# Patient Record
Sex: Female | Born: 1990 | Race: Black or African American | Hispanic: No | Marital: Single | Smoking: Current every day smoker
Health system: Southern US, Community
[De-identification: ages and names within clinical notes are randomized; demographics above are authoritative.]

## PROBLEM LIST (undated history)

## (undated) DIAGNOSIS — F1910 Other psychoactive substance abuse, uncomplicated: Secondary | ICD-10-CM

## (undated) DIAGNOSIS — Z789 Other specified health status: Secondary | ICD-10-CM

## (undated) DIAGNOSIS — Z8659 Personal history of other mental and behavioral disorders: Secondary | ICD-10-CM

## (undated) HISTORY — PX: ECTOPIC PREGNANCY SURGERY: SHX613

---
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
## 2012-07-19 ENCOUNTER — Emergency Department: Payer: Self-pay | Admitting: Emergency Medicine

## 2012-07-19 LAB — CBC
HCT: 38.3 % (ref 35.0–47.0)
HGB: 12.9 g/dL (ref 12.0–16.0)
MCHC: 33.8 g/dL (ref 32.0–36.0)
MCV: 90 fL (ref 80–100)
Platelet: 227 10*3/uL (ref 150–440)
WBC: 8.4 10*3/uL (ref 3.6–11.0)

## 2012-07-19 LAB — URINALYSIS, COMPLETE
Bilirubin,UR: NEGATIVE
Blood: NEGATIVE
Glucose,UR: NEGATIVE mg/dL (ref 0–75)
Nitrite: NEGATIVE
Ph: 7 (ref 4.5–8.0)
WBC UR: 1 /HPF (ref 0–5)

## 2012-07-19 LAB — BASIC METABOLIC PANEL
Anion Gap: 8 (ref 7–16)
BUN: 7 mg/dL (ref 7–18)
Chloride: 104 mmol/L (ref 98–107)
Co2: 28 mmol/L (ref 21–32)
Creatinine: 0.59 mg/dL — ABNORMAL LOW (ref 0.60–1.30)
Potassium: 3.5 mmol/L (ref 3.5–5.1)
Sodium: 140 mmol/L (ref 136–145)

## 2012-07-19 LAB — WET PREP, GENITAL

## 2012-08-06 ENCOUNTER — Emergency Department: Payer: Self-pay | Admitting: Emergency Medicine

## 2015-03-06 ENCOUNTER — Emergency Department
Admission: EM | Admit: 2015-03-06 | Discharge: 2015-03-06 | Disposition: A | Discharge status: Home / Self Care | Payer: Medicaid Other | Attending: Emergency Medicine | Admitting: Emergency Medicine

## 2015-03-06 ENCOUNTER — Encounter: Payer: Self-pay | Admitting: Medical Oncology

## 2015-03-06 ENCOUNTER — Emergency Department: Payer: Medicaid Other

## 2015-03-06 DIAGNOSIS — S01511A Laceration without foreign body of lip, initial encounter: Secondary | ICD-10-CM

## 2015-03-06 DIAGNOSIS — Y998 Other external cause status: Secondary | ICD-10-CM | POA: Diagnosis not present

## 2015-03-06 DIAGNOSIS — S1989XA Other specified injuries of other specified part of neck, initial encounter: Secondary | ICD-10-CM | POA: Diagnosis not present

## 2015-03-06 DIAGNOSIS — Y9372 Activity, wrestling: Secondary | ICD-10-CM | POA: Insufficient documentation

## 2015-03-06 DIAGNOSIS — Z720 Tobacco use: Secondary | ICD-10-CM | POA: Diagnosis not present

## 2015-03-06 DIAGNOSIS — Y929 Unspecified place or not applicable: Secondary | ICD-10-CM | POA: Insufficient documentation

## 2015-03-06 DIAGNOSIS — Z23 Encounter for immunization: Secondary | ICD-10-CM | POA: Diagnosis not present

## 2015-03-06 DIAGNOSIS — S0083XA Contusion of other part of head, initial encounter: Secondary | ICD-10-CM

## 2015-03-06 MED ORDER — KETOROLAC TROMETHAMINE 60 MG/2ML IM SOLN
60.0000 mg | Freq: Once | INTRAMUSCULAR | Status: AC
  Administered 2015-03-06: 60 mg via INTRAMUSCULAR

## 2015-03-06 MED ORDER — TETANUS-DIPHTH-ACELL PERTUSSIS 5-2.5-18.5 LF-MCG/0.5 IM SUSP
INTRAMUSCULAR | Status: AC
  Filled 2015-03-06: qty 0.5

## 2015-03-06 MED ORDER — TETANUS-DIPHTH-ACELL PERTUSSIS 5-2.5-18.5 LF-MCG/0.5 IM SUSP
0.5000 mL | Freq: Once | INTRAMUSCULAR | Status: AC
  Administered 2015-03-06: 0.5 mL via INTRAMUSCULAR

## 2015-03-06 MED ORDER — KETOROLAC TROMETHAMINE 60 MG/2ML IM SOLN
INTRAMUSCULAR | Status: AC
  Filled 2015-03-06: qty 2

## 2015-03-06 MED ORDER — AMOXICILLIN-POT CLAVULANATE 875-125 MG PO TABS: 1.0000 | ORAL_TABLET | Freq: Two times a day (BID) | ORAL | Status: AC

## 2015-03-06 MED ORDER — IBUPROFEN 800 MG PO TABS: 800.0000 mg | ORAL_TABLET | Freq: Three times a day (TID) | ORAL | Status: DC | PRN

## 2015-03-06 NOTE — ED Notes (Signed)
Pt left at this time prior to RN checking back in with pt. RN verbalized the medical recommendation to staying until med hold time is up. Pt refused and walked out. Pt stable and in no apparent distress at this time.
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
## 2015-03-06 NOTE — ED Notes (Signed)
Pt placed on med hold until 1945. Pt and friend verbalized understanding, no further needs at this time.
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
## 2015-03-06 NOTE — Discharge Instructions (Signed)
Mouth Laceration A mouth laceration is a cut inside the mouth.  HOME CARE  Rinse your mouth with warm salt water 4 to 6 times a day.  Brush your teeth as usual if you can.  Do not eat hot food or have hot drinks while your mouth is still numb.  Avoid acidic foods or other foods that bother your cut.  Only take medicine as told by your doctor.  Keep all doctor visits as told.  If there are stitches (sutures) in the mouth, do not play with them with your tongue. You may need a tetanus shot if:  You cannot remember when you had your last tetanus shot.  You have never had a tetanus shot. If you need a tetanus shot and you choose not to have one, you may get tetanus. Sickness from tetanus can be serious. GET HELP RIGHT AWAY IF:   Your cut or other parts of your face are puffy (swollen) or painful.  You have a fever.  Your throat is puffy or tender.  Your cut breaks open after stitches have been removed.  You see yellowish-white fluid (pus) coming from the cut. MAKE SURE YOU:   Understand these instructions.  Will watch your condition.  Will get help right away if you are not doing well or get worse. Document Released: 02/10/2008 Document Revised: 01/08/2014 Document Reviewed: 02/26/2011 [facility] Patient Information 2015 [place], [place]. This information is not intended to replace advice given to you by your health care provider. Make sure you discuss any questions you have with your health care provider.  Mandibular Contusion  A mandibular contusion is a deep bruise of your jaw. Contusions happen when an injury causes bleeding under the skin. Signs of bruising include pain, puffiness (swelling), and discolored skin. The contusion may turn blue, purple, or yellow. HOME CARE  Put ice on the injured area.  Put ice in a plastic bag.  Place a towel between your skin and the bag.  Leave the ice on for 15-20 minutes, 03-04 times a day.  Eat soft foods for 1 week. Soft  foods include:  Baby food.  Gelatin.  Cooked cereal.  Ice cream.  Applesauce.  Bananas.  Eggs.  Pasta.  Cottage cheese.  Soup.  Yogurt.  Cut food into small pieces. Avoid chewing gum or ice.  Only take medicines as told by your doctor.  Avoid opening your mouth widely. GET HELP RIGHT AWAY IF:  Your puffiness or pain is not helped by medicine.  You are not getting better.  You have cracking or clicking (crepitation) sounds coming from the jaw. MAKE SURE YOU:   Understand these instructions.  Will watch your condition.  Will get help right away if you are not doing well or get worse. Document Released: 08/13/2011 Document Revised: 02/23/2012 Document Reviewed: 08/13/2011 [facility] Patient Information 2015 [place], [place]. This information is not intended to replace advice given to you by your health care provider. Make sure you discuss any questions you have with your health care provider.

## 2015-04-26 ENCOUNTER — Emergency Department
Admission: EM | Admit: 2015-04-26 | Discharge: 2015-04-26 | Disposition: A | Discharge status: Home / Self Care | Payer: Medicaid Other | Attending: Emergency Medicine | Admitting: Emergency Medicine

## 2015-04-26 DIAGNOSIS — F22 Delusional disorders: Secondary | ICD-10-CM | POA: Diagnosis not present

## 2015-04-26 DIAGNOSIS — Z3202 Encounter for pregnancy test, result negative: Secondary | ICD-10-CM | POA: Diagnosis not present

## 2015-04-26 DIAGNOSIS — A64 Unspecified sexually transmitted disease: Secondary | ICD-10-CM | POA: Diagnosis not present

## 2015-04-26 DIAGNOSIS — Z720 Tobacco use: Secondary | ICD-10-CM | POA: Diagnosis not present

## 2015-04-26 DIAGNOSIS — F1210 Cannabis abuse, uncomplicated: Secondary | ICD-10-CM | POA: Diagnosis not present

## 2015-04-26 DIAGNOSIS — F1410 Cocaine abuse, uncomplicated: Secondary | ICD-10-CM | POA: Diagnosis not present

## 2015-04-26 DIAGNOSIS — N898 Other specified noninflammatory disorders of vagina: Secondary | ICD-10-CM | POA: Diagnosis present

## 2015-04-26 DIAGNOSIS — F1910 Other psychoactive substance abuse, uncomplicated: Secondary | ICD-10-CM

## 2015-04-26 LAB — URINALYSIS COMPLETE WITH MICROSCOPIC (ARMC ONLY)
Bilirubin Urine: NEGATIVE
Glucose, UA: NEGATIVE mg/dL
NITRITE: NEGATIVE
PROTEIN: NEGATIVE mg/dL
Specific Gravity, Urine: 1.017 (ref 1.005–1.030)
pH: 5 (ref 5.0–8.0)

## 2015-04-26 LAB — CBC
HCT: 39.1 % (ref 35.0–47.0)
Hemoglobin: 13 g/dL (ref 12.0–16.0)
MCH: 28.7 pg (ref 26.0–34.0)
MCHC: 33.2 g/dL (ref 32.0–36.0)
MCV: 86.5 fL (ref 80.0–100.0)
PLATELETS: 302 10*3/uL (ref 150–440)
RBC: 4.52 MIL/uL (ref 3.80–5.20)
RDW: 16.5 % — ABNORMAL HIGH (ref 11.5–14.5)
WBC: 10.9 10*3/uL (ref 3.6–11.0)

## 2015-04-26 LAB — PREGNANCY, URINE: PREG TEST UR: NEGATIVE

## 2015-04-26 LAB — BASIC METABOLIC PANEL
Anion gap: 9 (ref 5–15)
BUN: 8 mg/dL (ref 6–20)
CALCIUM: 9.6 mg/dL (ref 8.9–10.3)
CO2: 23 mmol/L (ref 22–32)
Chloride: 105 mmol/L (ref 101–111)
Creatinine, Ser: 0.64 mg/dL (ref 0.44–1.00)
Glucose, Bld: 87 mg/dL (ref 65–99)
Potassium: 4.4 mmol/L (ref 3.5–5.1)
Sodium: 137 mmol/L (ref 135–145)

## 2015-04-26 LAB — URINE DRUG SCREEN, QUALITATIVE (ARMC ONLY)
Amphetamines, Ur Screen: NOT DETECTED
BARBITURATES, UR SCREEN: NOT DETECTED
Benzodiazepine, Ur Scrn: NOT DETECTED
CANNABINOID 50 NG, UR ~~LOC~~: POSITIVE — AB
Cocaine Metabolite,Ur ~~LOC~~: POSITIVE — AB
MDMA (Ecstasy)Ur Screen: NOT DETECTED
Methadone Scn, Ur: NOT DETECTED
Opiate, Ur Screen: NOT DETECTED
PHENCYCLIDINE (PCP) UR S: NOT DETECTED
Tricyclic, Ur Screen: NOT DETECTED

## 2015-04-26 LAB — ACETAMINOPHEN LEVEL: Acetaminophen (Tylenol), Serum: <10 ug/mL — ABNORMAL LOW (ref 10–30)

## 2015-04-26 MED ORDER — AZITHROMYCIN 250 MG PO TABS
1000.0000 mg | ORAL_TABLET | Freq: Once | ORAL | Status: AC
  Administered 2015-04-26: 1000 mg via ORAL
  Filled 2015-04-26: qty 4

## 2015-04-26 MED ORDER — CEFTRIAXONE SODIUM 250 MG IJ SOLR
250.0000 mg | Freq: Once | INTRAMUSCULAR | Status: AC
  Administered 2015-04-26: 250 mg via INTRAMUSCULAR
  Filled 2015-04-26: qty 250

## 2015-04-26 MED ORDER — LORAZEPAM 2 MG PO TABS
2.0000 mg | ORAL_TABLET | Freq: Once | ORAL | Status: AC
  Administered 2015-04-26: 2 mg via ORAL
  Filled 2015-04-26: qty 1

## 2015-04-26 NOTE — ED Notes (Signed)
Patient appears more comfortable since taking ativan  PO.  Patient is lying in bed resting.  Patient still complains that she can't sleep but does not feel paranoid anymore.

## 2015-04-26 NOTE — ED Provider Notes (Addendum)
I accepted patient care at 3 PM. Patient was using substances last night and was having paranoia and jitteriness which were suspected to be due to the ecstasy and cocaine. Patient was referred to talk with psychiatry intake for evaluation and referral for outpatient resources for substance abuse. She is voluntary. At around 5 PM she was requesting to go home. She states her mom will come pick her up. She is not having any active hallucinations, depression, suicidal or homicidal ideation. She states that she is just going to use any more. She was seen by psychiatry intake, and she does not want a psychiatrist, and I do not think she needs emergency psychiatrist evaluation. I will refer her as an outpatient for [facility].  Governor Rooks, MD 04/26/15 1706  Governor Rooks, MD 04/26/15 1710

## 2015-04-26 NOTE — Consult Note (Signed)
  Psychiatry: Consult was received and I reviewed the case with intake coordinator Robinette Haines. Discussed case with ER Dr. Shaune Pollack.chart reviewed. Went to see the patient. She refused to speak with me. Stated that I could just read the chart if I wanted. Patient is not under involuntary commitment. She was requesting discharge and I believe that Dr. Shaune Pollack has allowed her to be discharged from the emergency room as there did not seem to be a clear indication of dangerousness. No further input at this point.

## 2015-04-26 NOTE — BH Assessment (Signed)
Assessment Note  Briana Anderson is an 24 y.o. female who presents to the ER initially with the with the hopes of getting tested for STD's. However, while in the ER, she began to display odd and bizarre behaviors. She began to expressed she was paranoid. Patient admits of ongoing use of mind altering substances. For the last two days, she and her friends were using Cocaine, THC, Alcohol and (Mollies) Ecstasy.  She states, it was purchased from someone they normal don't get it from. Patient believed something "wasn't right" when they were able to buy a large amount for the price. They initially thought they were high but as they continued to use, they realized, something was wrong. Patient states, she felt like there throat was closing in and her friend had to come to the ER, via EMS due to medical complications.    Patient is currently experiencing A/H. During the interview, patient was witnessed responding to internal stimuli. She reported she was seeing different figures and "demons" in the room.  Patient currently denies SI/HI.  She reports of having Mental Health Treatment in the past. It started when she was approximately 24 years old. [facility] was the last agency she was with. Company is now close. She was with them for four years. She states she was diagnosed with Bipolar, with psychotic features. She was unable to give much detail about the treatment. However, she does remember taking medication and stopped when she was 20. "Yea, my checked got cut off and I stop going."  Writer called patient mother (Briana Anderson-[phone]) and left a message for a return phone call, for collateral information.  Phone call was made in the presence of the patient and no identifying information, about the patient was left on the voicemail.    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 Axis IV: other psychosocial or environmental problems, problems related to social  environment, problems with access to health care services and problems with primary support group  Past Medical History: No past medical history on file.  No past surgical history on file.  Family History: No family history on file.  Social History:  reports that she has been smoking.  She does not have any smokeless tobacco history on file. She reports that she drinks alcohol. Her drug history is not on file.  Additional Social History:  Alcohol / Drug Use Pain Medications: None Reported Prescriptions: None Reported Over the Counter: None Reported History of alcohol / drug use?: Yes Longest period of sobriety (when/how long): No sober time reported Negative Consequences of Use: Personal relationships Withdrawal Symptoms:  (None Reported, currently having V/H.) Substance #1 Name of Substance 1: Mollies (Ecstasy) 1 - Age of First Use: 22 1 - Amount (size/oz): Unknown-"alot" 1 - Frequency: Used for the last two days 1 - Duration: Once at age 57 and the last two days. 1 - Last Use / Amount: 04/26/2015 Substance #2 Name of Substance 2: Cocaine  2 - Age of First Use: 20 2 - Amount (size/oz): 1 grams 2 - Frequency: 1 to 2 days 2 - Duration: 4 years 2 - Last Use / Amount: 04/25/2015 Substance #3 Name of Substance 3: THC 3 - Age of First Use: 18 3 - Amount (size/oz): 4 to 5 blunts 3 - Frequency: Daily 3 - Duration: 6 years 3 - Last Use / Amount: 04/26/2015 Substance #4 Name of Substance 4: Alcohol 4 - Age of First Use: 18 4 -  Amount (size/oz): 1 to 3 40oz beers. Based on how much money she have 4 - Frequency: Daily 4 - Duration: Daily 4 - Last Use / Amount: 04/25/2015  CIWA: CIWA-Ar BP: (!) 143/105 mmHg Pulse Rate: (!) 110 COWS:    Allergies: No Known Allergies  Home Medications:  (Not in a hospital admission)  OB/GYN Status:  Patient's last menstrual period was 03/26/2015 (approximate).  General Assessment Data Location of Assessment: [facility] ED TTS Assessment: In  system Is this a Tele or Face-to-Face Assessment?: Face-to-Face Is this an Initial Assessment or a Re-assessment for this encounter?: Initial Assessment Marital status: Single Maiden name: n/a Is patient pregnant?: No Pregnancy Status: No Living Arrangements: Children, Parent Can pt return to current living arrangement?: Yes Admission Status: Voluntary Is patient capable of signing voluntary admission?: Yes Referral Source: Self/Family/Friend Insurance type: Medicaid  Medical Screening Exam [facility] Walk-in ONLY) Medical Exam completed: Yes  Crisis Care Plan Living Arrangements: Children, Parent Name of Psychiatrist: n/a Name of Therapist: n/a  Education Status Is patient currently in school?: No Current Grade: n/a Highest grade of school patient has completed: 12th Grade Name of school: n/a Contact person: n/a  Risk to self with the past 6 months Suicidal Ideation: No Has patient been a risk to self within the past 6 months prior to admission? : No Suicidal Intent: No Has patient had any suicidal intent within the past 6 months prior to admission? : No Is patient at risk for suicide?: No Suicidal Plan?: No Has patient had any suicidal plan within the past 6 months prior to admission? : No Access to Means: No What has been your use of drugs/alcohol within the last 12 months?: Ecstasy, Cocaine, THC & Alcohol Previous Attempts/Gestures: No How many times?: 0 Other Self Harm Risks: 0 Triggers for Past Attempts: None known Intentional Self Injurious Behavior: None Family Suicide History: No Recent stressful life event(s):  (None Reported) Persecutory voices/beliefs?: No Depression: No Depression Symptoms:  (None Reported) Substance abuse history and/or treatment for substance abuse?: No Suicide prevention information given to non-admitted patients: Not applicable  Risk to Others within the past 6 months Homicidal Ideation: No Does patient have any lifetime risk of  violence toward others beyond the six months prior to admission? : No Thoughts of Harm to Others: No Current Homicidal Intent: No Current Homicidal Plan: No Access to Homicidal Means: No Identified Victim: None Reported History of harm to others?: No Assessment of Violence: None Noted Violent Behavior Description: None Reported Does patient have access to weapons?: No Criminal Charges Pending?: No Does patient have a court date: No Is patient on probation?: No  Psychosis Hallucinations: None noted, Visual Delusions: None noted  Mental Status Report Appearance/Hygiene: Unremarkable Eye Contact: Good Motor Activity: Freedom of movement Speech: Logical/coherent Level of Consciousness: Alert Mood: Anxious, Angry Affect: Appropriate to circumstance, Anxious, Fearful Anxiety Level: Moderate Thought Processes: Coherent, Relevant, Irrelevant (Patient is currently impaired from drug use) Judgement: Impaired Orientation: Person, Place, Time, Situation, Appropriate for developmental age Obsessive Compulsive Thoughts/Behaviors: Moderate  Cognitive Functioning Concentration: Decreased Memory: Recent Intact, Remote Intact IQ: Average Insight: Poor Impulse Control: Poor Appetite: Good Weight Loss: 0 Weight Gain: 0 Sleep: No Change Total Hours of Sleep: 6 Vegetative Symptoms: None  ADLScreening [facility] Services) Patient's cognitive ability adequate to safely complete daily activities?: Yes Patient able to express need for assistance with ADLs?: Yes Independently performs ADLs?: Yes (appropriate for developmental age)  Prior Inpatient Therapy Prior Inpatient Therapy: No Prior Therapy Dates: n/a  Prior Therapy Facilty/Provider(s): n/a Reason for Treatment: n/a  Prior Outpatient Therapy Prior Outpatient Therapy: Yes Prior Therapy Dates: 2012 Prior Therapy Facilty/Provider(s): [facility]  (Now closed) Reason for Treatment: Bipolar Does patient have an ACCT team?:  No Does patient have Intensive In-House Services?  : No Does patient have [facility] services? : No Does patient have P4CC services?: No  ADL Screening (condition at time of admission) Patient's cognitive ability adequate to safely complete daily activities?: Yes Patient able to express need for assistance with ADLs?: Yes Independently performs ADLs?: Yes (appropriate for developmental age)       Abuse/Neglect Assessment (Assessment to be complete while patient is alone) Physical Abuse: Denies Verbal Abuse: Denies Sexual Abuse: Denies Exploitation of patient/patient's resources: Denies Self-Neglect: Denies Values / Beliefs Cultural Requests During Hospitalization: None Spiritual Requests During Hospitalization: None Consults Spiritual Care Consult Needed: No Social Work Consult Needed: No Merchant navy officer (For Healthcare) Does patient have an advance directive?: No Would patient like information on creating an advanced directive?: No - patient declined information    Additional Information 1:1 In Past 12 Months?: No CIRT Risk: No Elopement Risk: No Does patient have medical clearance?: Yes  Child/Adolescent Assessment Running Away Risk: Denies (Patient is an adult)  Disposition:  Disposition Initial Assessment Completed for this Encounter: Yes Disposition of Patient: Other dispositions (Consult for Psych MD)  On Site Evaluation by:   Reviewed with Physician:    Lilyan Gilford, MS, LCAS,LPC, NCC, CCSI 04/26/2015 12:16 PM

## 2015-04-26 NOTE — Progress Notes (Signed)
Medicine given with Coleen present as witness.

## 2015-04-26 NOTE — ED Notes (Signed)
Patient out to desk telling secretary that she feels paranoid and that she wants to sleep but she can't.  Dr. Fanny Bien notified of abnormal behavior.

## 2015-04-26 NOTE — ED Notes (Signed)
Pt provided boxed lunch

## 2015-04-26 NOTE — ED Notes (Signed)
Pt to ED for STD check. Pt c/o vaginal discharge.
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
## 2015-04-26 NOTE — Progress Notes (Signed)
Patient continues to come to nurses station with complaints of paranoia and wanting to sleep.  Patient escorted back to room 17.  When questioned patient denies thoughts of harming herself or anyone else.  Vernona Rieger at bedside for safety.

## 2015-04-26 NOTE — Discharge Instructions (Signed)
Sexually Transmitted Disease °A sexually transmitted disease (STD) is a disease or infection that may be passed (transmitted) from person to person, usually during sexual activity. This may happen by way of saliva, semen, blood, vaginal mucus, or urine. Common STDs include:  °· Gonorrhea.   °· Chlamydia.   °· Syphilis.   °· HIV and AIDS.   °· Genital herpes.   °· Hepatitis B and C.   °· Trichomonas.   °· Human papillomavirus (HPV).   °· Pubic lice.   °· Scabies. °· Mites. °· Bacterial vaginosis. °WHAT ARE CAUSES OF STDs? °An STD may be caused by bacteria, a virus, or parasites. STDs are often transmitted during sexual activity if one person is infected. However, they may also be transmitted through nonsexual means. STDs may be transmitted after:  °· Sexual intercourse with an infected person.   °· Sharing sex toys with an infected person.   °· Sharing needles with an infected person or using unclean piercing or tattoo needles. °· Having intimate contact with the genitals, mouth, or rectal areas of an infected person.   °· Exposure to infected fluids during birth. °WHAT ARE THE SIGNS AND SYMPTOMS OF STDs? °Different STDs have different symptoms. Some people may not have any symptoms. If symptoms are present, they may include:  °· Painful or bloody urination.   °· Pain in the pelvis, abdomen, vagina, anus, throat, or eyes.   °· A skin rash, itching, or irritation. °· Growths, ulcerations, blisters, or sores in the genital and anal areas. °· Abnormal vaginal discharge with or without bad odor.   °· Penile discharge in men.   °· Fever.   °· Pain or bleeding during sexual intercourse.   °· Swollen glands in the groin area.   °· Yellow skin and eyes (jaundice). This is seen with hepatitis.   °· Swollen testicles. °· Infertility. °· Sores and blisters in the mouth. °HOW ARE STDs DIAGNOSED? °To make a diagnosis, your health care provider may:  °· Take a medical history.   °· Perform a physical exam.   °· Take a sample of  any discharge to examine. °· Swab the throat, cervix, opening to the penis, rectum, or vagina for testing. °· Test a sample of your first morning urine.   °· Perform blood tests.   °· Perform a Pap test, if this applies.   °· Perform a colposcopy.   °· Perform a laparoscopy.   °HOW ARE STDs TREATED? ° Treatment depends on the STD. Some STDs may be treated but not cured.  °· Chlamydia, gonorrhea, trichomonas, and syphilis can be cured with antibiotic medicine.   °· Genital herpes, hepatitis, and HIV can be treated, but not cured, with prescribed medicines. The medicines lessen symptoms.   °· Genital warts from HPV can be treated with medicine or by freezing, burning (electrocautery), or surgery. Warts may come back.   °· HPV cannot be cured with medicine or surgery. However, abnormal areas may be removed from the cervix, vagina, or vulva.   °· If your diagnosis is confirmed, your recent sexual partners need treatment. This is true even if they are symptom-free or have a negative culture or evaluation. They should not have sex until their health care providers say it is okay. °HOW CAN I REDUCE MY RISK OF GETTING AN STD? °Take these steps to reduce your risk of getting an STD: °· Use latex condoms, dental dams, and water-soluble lubricants during sexual activity. Do not use petroleum jelly or oils. °· Avoid having multiple sex partners. °· Do not have sex with someone who has other sex partners. °· Do not have sex with anyone you do not know or who is at   high risk for an STD.  Avoid risky sex practices that can break your skin.  Do not have sex if you have open sores on your mouth or skin.  Avoid drinking too much alcohol or taking illegal drugs. Alcohol and drugs can affect your judgment and put you in a vulnerable position.  Avoid engaging in oral and anal sex acts.  Get vaccinated for HPV and hepatitis. If you have not received these vaccines in the past, talk to your health care provider about whether one  or both might be right for you.   If you are at risk of being infected with HIV, it is recommended that you take a prescription medicine daily to prevent HIV infection. This is called pre-exposure prophylaxis (PrEP). You are considered at risk if:  You are a man who has sex with other men (MSM).  You are a heterosexual man or woman and are sexually active with more than one partner.  You take drugs by injection.  You are sexually active with a partner who has HIV.  Talk with your health care provider about whether you are at high risk of being infected with HIV. If you choose to begin PrEP, you should first be tested for HIV. You should then be tested every 3 months for as long as you are taking PrEP.  WHAT SHOULD I DO IF I THINK I HAVE AN STD?  See your health care provider.   Tell your sexual partner(s). They should be tested and treated for any STDs.  Do not have sex until your health care provider says it is okay. WHEN SHOULD I GET IMMEDIATE MEDICAL CARE? Contact your health care provider right away if:   You have severe abdominal pain. Develop fever, nausea or vomiting, weakness, or other new concerns arise.  You are a man and notice swelling or pain in your testicles.  You are a woman and notice swelling or pain in your vagina. Document Released: 11/14/2002 Document Revised: 08/29/2013 Document Reviewed: 03/14/2013 [facility] Patient Information 2015 [place], [place]. This information is not intended to replace advice given to you by your health care provider. Make sure you discuss any questions you have with your health care provider.

## 2015-04-26 NOTE — ED Provider Notes (Signed)
[facility] Emergency Department Provider Note  ____________________________________________  Time seen: Approximately 7:28 AM  I have reviewed the triage vital signs and the nursing notes.   HISTORY  Chief Complaint SEXUALLY TRANSMITTED DISEASE    HPI Briana Anderson is a 24 y.o. female denies previous medical history. She reports that her partner was diagnosed with gonorrhea, and that she is here for evaluation. She's noticed a slight amount of vaginal discharge, but states this is mostly like the beginning of her period which is due. She's had slight vaginal leading for about one day. She denies pregnancy. Denies being in any pain. No fevers or chills. No chest pain or trouble breathing.  Patient otherwise feels well. She states that her partner informed her that he was diagnosed gonorrhea.   No past medical history on file.  There are no active problems to display for this patient.   No past surgical history on file.  No current outpatient prescriptions on file.  Allergies Review of patient's allergies indicates no known allergies.  No family history on file.  Social History Social History  Substance Use Topics  . Smoking status: Current Every Day Smoker -- 0.50 packs/day  . Smokeless tobacco: Not on file  . Alcohol Use: Yes    Review of Systems Constitutional: No fever/chills Eyes: No visual changes. ENT: No sore throat. Cardiovascular: Denies chest pain. Respiratory: Denies shortness of breath. Gastrointestinal: No abdominal pain.  No nausea, no vomiting.  No diarrhea.  No constipation. Genitourinary: Negative for dysuria. Musculoskeletal: Negative for back pain. Skin: Negative for rash. Neurological: Negative for headaches, focal weakness or numbness.  10-point ROS otherwise negative.  ____________________________________________   PHYSICAL EXAM:  VITAL SIGNS: ED Triage Vitals  Enc Vitals Group     BP 04/26/15 0435  148/93 mmHg     Pulse Rate 04/26/15 0435 100     Resp 04/26/15 0435 18     Temp 04/26/15 0435 97.4 F (36.3 C)     Temp Source 04/26/15 0435 Oral     SpO2 04/26/15 0435 100 %     Weight 04/26/15 0435 138 lb (62.596 kg)     Height 04/26/15 0435 5\' 2"  (1.575 m)     Head Cir --      Peak Flow --      Pain Score 04/26/15 0445 0     Pain Loc --      Pain Edu? --      Excl. in GC? --     Constitutional: Alert and oriented. Well appearing and in no acute distress. Eyes: Conjunctivae are normal. PERRL. EOMI. Head: Atraumatic. Nose: No congestion/rhinnorhea. Mouth/Throat: Mucous membranes are moist.  Oropharynx non-erythematous. Neck: No stridor.   Cardiovascular: Normal rate, regular rhythm. Grossly normal heart sounds.  Good peripheral circulation. Respiratory: Normal respiratory effort.  No retractions. Lungs CTAB. Gastrointestinal: Soft and nontender. No distention. No abdominal bruits. No CVA tenderness. Musculoskeletal: No lower extremity tenderness nor edema.  No joint effusions. Neurologic:  Normal speech and language. No gross focal neurologic deficits are appreciated. No gait instability. Skin:  Skin is warm, dry and intact. No rash noted. Psychiatric: Mood and affect are normal. Speech and behavior are normal.  ____________________________________________   LABS (all labs ordered are listed, but only abnormal results are displayed)  Labs Reviewed  URINALYSIS COMPLETEWITH MICROSCOPIC ([facility] ONLY) - Abnormal; Notable for the following:    Color, Urine YELLOW (*)    APPearance HAZY (*)    Ketones, ur 1+ (*)  Hgb urine dipstick 3+ (*)    Leukocytes, UA 2+ (*)    Bacteria, UA RARE (*)    Squamous Epithelial / LPF 6-30 (*)    All other components within normal limits  CBC - Abnormal; Notable for the following:    RDW 16.5 (*)    All other components within normal limits  ACETAMINOPHEN LEVEL - Abnormal; Notable for the following:    Acetaminophen (Tylenol), Serum <10  (*)    All other components within normal limits  URINE DRUG SCREEN, QUALITATIVE ([facility] ONLY) - Abnormal; Notable for the following:    Cocaine Metabolite,Ur [place] POSITIVE (*)    Cannabinoid 50 Ng, Ur  POSITIVE (*)    All other components within normal limits  URINE CULTURE  PREGNANCY, URINE  BASIC METABOLIC PANEL   ____________________________________________  EKG   ____________________________________________  RADIOLOGY   ____________________________________________   PROCEDURES  Procedure(s) performed: None  Critical Care performed: No  ____________________________________________   INITIAL IMPRESSION / ASSESSMENT AND PLAN / ED COURSE  Pertinent labs & imaging results that were available during my care of the patient were reviewed by me and considered in my medical decision making (see chart for details).  Patient presents with asymptomatic evaluation for STD screening. Based on the history which she gives in sexual activity with patient with no gonorrhea, we will treat her for potential STD.  Patient has no signs or symptoms suggest acute infection. She does have some slight vaginal discharge but appears to be a slight amount of bleeding which sutured severe.. No fevers or chills. Abdomen soft nontender nondistended. We'll treat with Rocephin and azithromycin in the emergency room as recommended by CDC guidelines.  Return precautions advised and also recommend follow-up with her [facility] health Department.  ----------------------------------------- 8:16 AM on 04/26/2015 -----------------------------------------  Patient now reporting that she is starting to feel little paranoid about being at the hospital. She reports that last night she did an excessive amount of cocaine and ecstasy. Now she states that she feels like she is distal paranoid and feels worried. She denies wine herself or anyone else. No hallucinations., Based upon her symptomatology we  will obtain basic psychiatric screening labs and drug screen. She is quite voluntary, she does not have any symptoms of self injury, demonstrated any hallucinations, or homicidal or violent symptoms. I suspect, once the substances that she was abusing have metabolized out of her system that she will feel improved. We will continue to observe her in the ER.  ----------------------------------------- 10:15 AM on 04/26/2015 -----------------------------------------  Patient noted to be pacing the room. She states she feels very anxious. We'll give her 2 mg of Ativan, as we await consultation from TTS and psychiatry. She is in no distress. She does appear anxious and pacing however.  ----------------------------------------- 3:27 PM on 04/26/2015 -----------------------------------------  Patient remains calm. No distress. Seen by TTS, and we await consultation by psychiatry. Ongoing care and disposition assigned to Dr. Governor Rooks. ____________________________________________   FINAL CLINICAL IMPRESSION(S) / ED DIAGNOSES  Final diagnoses:  Sexually transmitted infection  Substance abuse  Paranoia      Sharyn Creamer, MD 04/26/15 [id number]

## 2015-04-28 LAB — URINE CULTURE: SPECIAL REQUESTS: NORMAL

## 2015-05-10 ENCOUNTER — Emergency Department
Admission: EM | Admit: 2015-05-10 | Discharge: 2015-05-10 | Disposition: A | Discharge status: Home / Self Care | Payer: Medicaid Other | Attending: Emergency Medicine | Admitting: Emergency Medicine

## 2015-05-10 ENCOUNTER — Encounter: Payer: Self-pay | Admitting: Emergency Medicine

## 2015-05-10 ENCOUNTER — Emergency Department: Payer: Medicaid Other

## 2015-05-10 DIAGNOSIS — Y9289 Other specified places as the place of occurrence of the external cause: Secondary | ICD-10-CM | POA: Insufficient documentation

## 2015-05-10 DIAGNOSIS — Y9389 Activity, other specified: Secondary | ICD-10-CM | POA: Diagnosis not present

## 2015-05-10 DIAGNOSIS — Y998 Other external cause status: Secondary | ICD-10-CM | POA: Insufficient documentation

## 2015-05-10 DIAGNOSIS — F1210 Cannabis abuse, uncomplicated: Secondary | ICD-10-CM | POA: Diagnosis not present

## 2015-05-10 DIAGNOSIS — Z720 Tobacco use: Secondary | ICD-10-CM | POA: Insufficient documentation

## 2015-05-10 DIAGNOSIS — F10120 Alcohol abuse with intoxication, uncomplicated: Secondary | ICD-10-CM | POA: Insufficient documentation

## 2015-05-10 DIAGNOSIS — S01511A Laceration without foreign body of lip, initial encounter: Secondary | ICD-10-CM | POA: Insufficient documentation

## 2015-05-10 DIAGNOSIS — F10920 Alcohol use, unspecified with intoxication, uncomplicated: Secondary | ICD-10-CM

## 2015-05-10 DIAGNOSIS — S0511XA Contusion of eyeball and orbital tissues, right eye, initial encounter: Secondary | ICD-10-CM | POA: Diagnosis not present

## 2015-05-10 DIAGNOSIS — S0083XA Contusion of other part of head, initial encounter: Secondary | ICD-10-CM

## 2015-05-10 DIAGNOSIS — S0990XA Unspecified injury of head, initial encounter: Secondary | ICD-10-CM | POA: Diagnosis present

## 2015-05-10 DIAGNOSIS — S0181XA Laceration without foreign body of other part of head, initial encounter: Secondary | ICD-10-CM | POA: Diagnosis not present

## 2015-05-10 HISTORY — DX: Other psychoactive substance abuse, uncomplicated: F19.10

## 2015-05-10 HISTORY — DX: Other specified health status: Z78.9

## 2015-05-10 HISTORY — DX: Personal history of other mental and behavioral disorders: Z86.59

## 2015-05-10 LAB — CBC WITH DIFFERENTIAL/PLATELET
BASOS PCT: 1 %
Basophils Absolute: 0 10*3/uL (ref 0–0.1)
EOS ABS: 0 10*3/uL (ref 0–0.7)
EOS PCT: 0 %
HCT: 38 % (ref 35.0–47.0)
HEMOGLOBIN: 12.6 g/dL (ref 12.0–16.0)
Lymphocytes Relative: 31 %
Lymphs Abs: 2.9 10*3/uL (ref 1.0–3.6)
MCH: 28.5 pg (ref 26.0–34.0)
MCHC: 33 g/dL (ref 32.0–36.0)
MCV: 86.4 fL (ref 80.0–100.0)
Monocytes Absolute: 0.6 10*3/uL (ref 0.2–0.9)
Monocytes Relative: 7 %
NEUTROS PCT: 61 %
Neutro Abs: 5.7 10*3/uL (ref 1.4–6.5)
PLATELETS: 258 10*3/uL (ref 150–440)
RBC: 4.4 MIL/uL (ref 3.80–5.20)
RDW: 16.1 % — ABNORMAL HIGH (ref 11.5–14.5)
WBC: 9.4 10*3/uL (ref 3.6–11.0)

## 2015-05-10 LAB — URINALYSIS COMPLETE WITH MICROSCOPIC (ARMC ONLY)
BILIRUBIN URINE: NEGATIVE
Bacteria, UA: NONE SEEN
GLUCOSE, UA: NEGATIVE mg/dL
HGB URINE DIPSTICK: NEGATIVE
Leukocytes, UA: NEGATIVE
Nitrite: NEGATIVE
Protein, ur: NEGATIVE mg/dL
Specific Gravity, Urine: 1.019 (ref 1.005–1.030)
pH: 6 (ref 5.0–8.0)

## 2015-05-10 LAB — URINE DRUG SCREEN, QUALITATIVE (ARMC ONLY)
AMPHETAMINES, UR SCREEN: NOT DETECTED
BARBITURATES, UR SCREEN: NOT DETECTED
Benzodiazepine, Ur Scrn: NOT DETECTED
COCAINE METABOLITE, UR ~~LOC~~: NOT DETECTED
Cannabinoid 50 Ng, Ur ~~LOC~~: POSITIVE — AB
MDMA (ECSTASY) UR SCREEN: NOT DETECTED
METHADONE SCREEN, URINE: NOT DETECTED
OPIATE, UR SCREEN: NOT DETECTED
Phencyclidine (PCP) Ur S: NOT DETECTED
TRICYCLIC, UR SCREEN: NOT DETECTED

## 2015-05-10 LAB — BASIC METABOLIC PANEL
Anion gap: 9 (ref 5–15)
BUN: 7 mg/dL (ref 6–20)
CO2: 25 mmol/L (ref 22–32)
Calcium: 9.1 mg/dL (ref 8.9–10.3)
Chloride: 108 mmol/L (ref 101–111)
Creatinine, Ser: 0.78 mg/dL (ref 0.44–1.00)
GFR calc non Af Amer: >60 mL/min (ref >60)
Glucose, Bld: 78 mg/dL (ref 65–99)
Potassium: 3.4 mmol/L — ABNORMAL LOW (ref 3.5–5.1)
SODIUM: 142 mmol/L (ref 135–145)

## 2015-05-10 LAB — ETHANOL: ALCOHOL ETHYL (B): 164 mg/dL — AB (ref <5)

## 2015-05-10 LAB — CK: Total CK: 344 U/L — ABNORMAL HIGH (ref 38–234)

## 2015-05-10 LAB — PREGNANCY, URINE: Preg Test, Ur: NEGATIVE

## 2015-05-10 MED ORDER — SODIUM CHLORIDE 0.9 % IV BOLUS (SEPSIS)
1000.0000 mL | INTRAVENOUS | Status: AC
  Administered 2015-05-10: 1000 mL via INTRAVENOUS

## 2015-05-10 MED ORDER — LIDOCAINE HCL (PF) 1 % IJ SOLN
INTRAMUSCULAR | Status: AC
  Filled 2015-05-10: qty 5

## 2015-05-10 NOTE — ED Notes (Signed)
Pt discharged home with mother after verbalizing understanding of discharge instructions; nad noted. 
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
## 2015-05-10 NOTE — ED Notes (Signed)
Pt given ice cream and applesauce

## 2015-07-29 ENCOUNTER — Encounter: Payer: Self-pay | Admitting: *Deleted

## 2015-07-29 ENCOUNTER — Emergency Department: Payer: Medicaid Other

## 2015-07-29 ENCOUNTER — Emergency Department
Admission: EM | Admit: 2015-07-29 | Discharge: 2015-07-29 | Disposition: A | Discharge status: Home / Self Care | Payer: Medicaid Other | Attending: Emergency Medicine | Admitting: Emergency Medicine

## 2015-07-29 DIAGNOSIS — S8991XA Unspecified injury of right lower leg, initial encounter: Secondary | ICD-10-CM | POA: Diagnosis present

## 2015-07-29 DIAGNOSIS — S80211A Abrasion, right knee, initial encounter: Secondary | ICD-10-CM | POA: Diagnosis not present

## 2015-07-29 DIAGNOSIS — W108XXA Fall (on) (from) other stairs and steps, initial encounter: Secondary | ICD-10-CM | POA: Diagnosis not present

## 2015-07-29 DIAGNOSIS — Y998 Other external cause status: Secondary | ICD-10-CM | POA: Diagnosis not present

## 2015-07-29 DIAGNOSIS — Y9289 Other specified places as the place of occurrence of the external cause: Secondary | ICD-10-CM | POA: Insufficient documentation

## 2015-07-29 DIAGNOSIS — S8001XA Contusion of right knee, initial encounter: Secondary | ICD-10-CM | POA: Insufficient documentation

## 2015-07-29 DIAGNOSIS — S80212A Abrasion, left knee, initial encounter: Secondary | ICD-10-CM | POA: Diagnosis not present

## 2015-07-29 DIAGNOSIS — F17200 Nicotine dependence, unspecified, uncomplicated: Secondary | ICD-10-CM | POA: Insufficient documentation

## 2015-07-29 DIAGNOSIS — S8002XA Contusion of left knee, initial encounter: Secondary | ICD-10-CM | POA: Insufficient documentation

## 2015-07-29 DIAGNOSIS — Y9389 Activity, other specified: Secondary | ICD-10-CM | POA: Insufficient documentation

## 2015-07-29 MED ORDER — IBUPROFEN 800 MG PO TABS: 800.0000 mg | ORAL_TABLET | Freq: Three times a day (TID) | ORAL | Status: DC | PRN

## 2015-07-29 MED ORDER — BACITRACIN-NEOMYCIN-POLYMYXIN OINTMENT TUBE: TOPICAL_OINTMENT | Freq: Once | CUTANEOUS | Status: DC

## 2015-07-29 MED ORDER — BACITRACIN ZINC 500 UNIT/GM EX OINT
TOPICAL_OINTMENT | CUTANEOUS | Status: AC
  Administered 2015-07-29: 1 via TOPICAL
  Filled 2015-07-29: qty 0.9

## 2015-07-29 MED ORDER — CYCLOBENZAPRINE HCL 5 MG PO TABS: 5.0000 mg | ORAL_TABLET | Freq: Three times a day (TID) | ORAL | Status: DC | PRN

## 2015-07-29 MED ORDER — BACITRACIN ZINC 500 UNIT/GM EX OINT
TOPICAL_OINTMENT | Freq: Once | CUTANEOUS | Status: AC
  Administered 2015-07-29: 1 via TOPICAL

## 2015-07-29 NOTE — ED Provider Notes (Signed)
[facility] Emergency Department Provider Note  ____________________________________________  Time seen: Approximately 7:22 PM  I have reviewed the triage vital signs and the nursing notes.   HISTORY  Chief Complaint Fall    HPI Briana Anderson is a 24 y.o. female resents for bilateral knee pain. Patient states that she fell down approximately 14 steps yesterday complaints of difficulty walking. Has also multiple abrasions noted to both legs.Patient admits to being under the influence of marijuana at that time. Denies any alcohol use.   Past Medical History  Diagnosis Date  . Polysubstance abuse   . Unknown if patient has history of psychiatric disorder     There are no active problems to display for this patient.   History reviewed. No pertinent past surgical history.  Current Outpatient Rx  Name  Route  Sig  Dispense  Refill  . cyclobenzaprine (FLEXERIL) 5 MG tablet   Oral   Take 1 tablet (5 mg total) by mouth every 8 (eight) hours as needed for muscle spasms.   30 tablet   0   . ibuprofen (ADVIL,MOTRIN) 800 MG tablet   Oral   Take 1 tablet (800 mg total) by mouth every 8 (eight) hours as needed.   30 tablet   0     Allergies Review of patient's allergies indicates no known allergies.  No family history on file.  Social History Social History  Substance Use Topics  . Smoking status: Current Every Day Smoker -- 0.50 packs/day  . Smokeless tobacco: None  . Alcohol Use: Yes    Review of Systems Constitutional: No fever/chills Eyes: No visual changes. ENT: No sore throat. Cardiovascular: Denies chest pain. Respiratory: Denies shortness of breath. Gastrointestinal: No abdominal pain.  No nausea, no vomiting.  No diarrhea.  No constipation. Genitourinary: Negative for dysuria. Musculoskeletal: Positive for bilateral knee pain. Skin: Positive for abrasions to both knees. Neurological: Negative for headaches, focal weakness or  numbness.  10-point ROS otherwise negative.  ____________________________________________   PHYSICAL EXAM:  VITAL SIGNS: ED Triage Vitals  Enc Vitals Group     BP 07/29/15 1800 143/84 mmHg     Pulse Rate 07/29/15 1800 96     Resp 07/29/15 1800 20     Temp 07/29/15 1800 98.2 F (36.8 C)     Temp Source 07/29/15 1800 Oral     SpO2 07/29/15 1800 99 %     Weight 07/29/15 1800 142 lb (64.411 kg)     Height 07/29/15 1800  (1.575 m)     Head Cir --      Peak Flow --      Pain Score 07/29/15 1801 2     Pain Loc --      Pain Edu? --      Excl. in GC? --     Constitutional: Alert and oriented. Well appearing and in no acute distress. Neck: No stridor.   Cardiovascular: Normal rate, regular rhythm. Grossly normal heart sounds.  Good peripheral circulation. Respiratory: Normal respiratory effort.  No retractions. Lungs CTAB. Gastrointestinal: Soft and nontender. No distention. No abdominal bruits. No CVA tenderness. Musculoskeletal: Bilateral knee tenderness with no joint effusions. No evidence of ecchymosis or bruising. Full range of motion of both knees. Increased pain with flexion probable secondary to stretching the abrasions. Neurologic:  Normal speech and language. No gross focal neurologic deficits are appreciated. No gait instability. Skin: Skin is warm and dry with multiple abrasions noted to both knees. No lacerations or puncture  wounds. Psychiatric: Mood and affect are normal. Speech and behavior are normal.  ____________________________________________   LABS (all labs ordered are listed, but only abnormal results are displayed)  Labs Reviewed - No data to display ____________________________________________   RADIOLOGY  Bilateral knee x-rays negative. ____________________________________________   PROCEDURES  Procedure(s) performed: None  Critical Care performed: No  ____________________________________________   INITIAL IMPRESSION / ASSESSMENT AND  PLAN / ED COURSE  Pertinent labs & imaging results that were available during my care of the patient were reviewed by me and considered in my medical decision making (see chart for details).  Status post fall with bilateral knee contusions and abrasions. Neosporin dressings applied to both legs. Rx given for ibuprofen 800 mg and to follow up with PCP or return to the ER with any worsening symptomology. Patient voices no other Medical complaints at this time. ____________________________________________   FINAL CLINICAL IMPRESSION(S) / ED DIAGNOSES  Final diagnoses:  Fall (on) (from) other stairs and steps, initial encounter  Contusion, knee, right, initial encounter  Knee contusion, left, initial encounter      Evangeline DakinCharles M Cinnamon Morency, PA-C 07/29/15 2252  Myrna Blazeravid Matthew Schaevitz, MD 07/29/15 [phone]

## 2015-07-29 NOTE — ED Notes (Signed)
Pt fell yesterday complaining of bilateral knee pain

## 2015-07-29 NOTE — ED Notes (Signed)
Pt states she fell yesterday down approximately 14 steps; pt presents today with bilateral knee pain, abrasions to bilateral knees as well.  Pt A/Ox4, ambulatory to room, no acute distress.

## 2017-01-14 ENCOUNTER — Emergency Department (HOSPITAL_COMMUNITY)
Admission: EM | Admit: 2017-01-14 | Discharge: 2017-01-15 | Disposition: A | Discharge status: Home / Self Care | Payer: Medicaid Other | Attending: Emergency Medicine | Admitting: Emergency Medicine

## 2017-01-14 ENCOUNTER — Encounter (HOSPITAL_COMMUNITY): Payer: Self-pay | Admitting: Emergency Medicine

## 2017-01-14 DIAGNOSIS — Z23 Encounter for immunization: Secondary | ICD-10-CM | POA: Insufficient documentation

## 2017-01-14 DIAGNOSIS — S0101XA Laceration without foreign body of scalp, initial encounter: Secondary | ICD-10-CM | POA: Insufficient documentation

## 2017-01-14 DIAGNOSIS — F17200 Nicotine dependence, unspecified, uncomplicated: Secondary | ICD-10-CM | POA: Insufficient documentation

## 2017-01-14 DIAGNOSIS — S0990XA Unspecified injury of head, initial encounter: Secondary | ICD-10-CM | POA: Diagnosis present

## 2017-01-14 DIAGNOSIS — Y929 Unspecified place or not applicable: Secondary | ICD-10-CM | POA: Insufficient documentation

## 2017-01-14 DIAGNOSIS — Y9389 Activity, other specified: Secondary | ICD-10-CM | POA: Diagnosis not present

## 2017-01-14 DIAGNOSIS — Z79899 Other long term (current) drug therapy: Secondary | ICD-10-CM | POA: Diagnosis not present

## 2017-01-14 DIAGNOSIS — T148XXA Other injury of unspecified body region, initial encounter: Secondary | ICD-10-CM

## 2017-01-14 DIAGNOSIS — Y999 Unspecified external cause status: Secondary | ICD-10-CM | POA: Diagnosis not present

## 2017-01-14 MED ORDER — ACETAMINOPHEN 325 MG PO TABS
650.0000 mg | ORAL_TABLET | Freq: Once | ORAL | Status: AC
  Administered 2017-01-14: 650 mg via ORAL
  Filled 2017-01-14: qty 2

## 2017-01-14 MED ORDER — TETANUS-DIPHTH-ACELL PERTUSSIS 5-2.5-18.5 LF-MCG/0.5 IM SUSP
0.5000 mL | Freq: Once | INTRAMUSCULAR | Status: AC
  Administered 2017-01-14: 0.5 mL via INTRAMUSCULAR
  Filled 2017-01-14: qty 0.5

## 2017-01-14 MED ORDER — LORAZEPAM 1 MG PO TABS
1.0000 mg | ORAL_TABLET | Freq: Once | ORAL | Status: AC
  Administered 2017-01-14: 1 mg via ORAL
  Filled 2017-01-14: qty 1
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
## 2017-01-14 NOTE — ED Notes (Signed)
Patient transported to X-ray 
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
## 2017-01-15 ENCOUNTER — Emergency Department (HOSPITAL_COMMUNITY): Payer: Medicaid Other

## 2017-01-15 NOTE — Discharge Instructions (Signed)
The scalp laceration fortunately is not deep and will not need any staples. We would like you to apply over the counter neosporin ointment to the scalp and keep the area clean and dry.  Keep the area clean and dry. RETURN TO THE ER IF THERE IS INCREASED PAIN, REDNESS, PUS COMING OUT from the wound site.

## 2017-09-13 ENCOUNTER — Emergency Department: Payer: Medicaid Other

## 2017-09-13 ENCOUNTER — Emergency Department
Admission: EM | Admit: 2017-09-13 | Discharge: 2017-09-13 | Disposition: A | Discharge status: Home / Self Care | Payer: Medicaid Other | Attending: Emergency Medicine | Admitting: Emergency Medicine

## 2017-09-13 ENCOUNTER — Other Ambulatory Visit: Payer: Self-pay

## 2017-09-13 DIAGNOSIS — F17200 Nicotine dependence, unspecified, uncomplicated: Secondary | ICD-10-CM | POA: Insufficient documentation

## 2017-09-13 DIAGNOSIS — Y929 Unspecified place or not applicable: Secondary | ICD-10-CM | POA: Diagnosis not present

## 2017-09-13 DIAGNOSIS — M7918 Myalgia, other site: Secondary | ICD-10-CM

## 2017-09-13 DIAGNOSIS — M79622 Pain in left upper arm: Secondary | ICD-10-CM | POA: Diagnosis not present

## 2017-09-13 DIAGNOSIS — Y9389 Activity, other specified: Secondary | ICD-10-CM | POA: Insufficient documentation

## 2017-09-13 DIAGNOSIS — M25562 Pain in left knee: Secondary | ICD-10-CM | POA: Diagnosis present

## 2017-09-13 DIAGNOSIS — M545 Low back pain: Secondary | ICD-10-CM | POA: Insufficient documentation

## 2017-09-13 DIAGNOSIS — M79621 Pain in right upper arm: Secondary | ICD-10-CM | POA: Insufficient documentation

## 2017-09-13 DIAGNOSIS — W19XXXA Unspecified fall, initial encounter: Secondary | ICD-10-CM | POA: Diagnosis not present

## 2017-09-13 MED ORDER — CYCLOBENZAPRINE HCL 5 MG PO TABS: ORAL_TABLET | ORAL | 0 refills | Status: DC

## 2017-09-13 MED ORDER — KETOROLAC TROMETHAMINE 30 MG/ML IJ SOLN
30.0000 mg | Freq: Once | INTRAMUSCULAR | Status: AC
  Administered 2017-09-13: 30 mg via INTRAVENOUS
  Filled 2017-09-13: qty 1

## 2017-09-13 MED ORDER — IBUPROFEN 800 MG PO TABS: 800.0000 mg | ORAL_TABLET | Freq: Three times a day (TID) | ORAL | 0 refills | Status: DC | PRN

## 2017-09-13 MED ORDER — ORPHENADRINE CITRATE 30 MG/ML IJ SOLN: 60.0000 mg | Freq: Two times a day (BID) | INTRAMUSCULAR | Status: DC

## 2017-09-13 MED ORDER — CYCLOBENZAPRINE HCL 10 MG PO TABS
ORAL_TABLET | ORAL | Status: AC
  Administered 2017-09-13: 10 mg
  Filled 2017-09-13: qty 1
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
## 2017-09-13 NOTE — ED Notes (Signed)
Ace wrap applied, pt tolerated well.  Pt given instructions on use of crutches, pt verbalized understanding.  Family with pt.

## 2017-09-13 NOTE — ED Triage Notes (Signed)
Pt c/o left knee pain for the past several days, states she twisted recently.

## 2019-04-02 ENCOUNTER — Emergency Department
Admission: EM | Admit: 2019-04-02 | Discharge: 2019-04-02 | Disposition: A | Discharge status: Home / Self Care | Payer: Medicaid Other | Attending: Emergency Medicine | Admitting: Emergency Medicine

## 2019-04-02 ENCOUNTER — Emergency Department: Payer: Medicaid Other

## 2019-04-02 ENCOUNTER — Other Ambulatory Visit: Payer: Self-pay

## 2019-04-02 ENCOUNTER — Encounter: Payer: Self-pay | Admitting: Emergency Medicine

## 2019-04-02 DIAGNOSIS — Y9355 Activity, bike riding: Secondary | ICD-10-CM | POA: Diagnosis not present

## 2019-04-02 DIAGNOSIS — F1210 Cannabis abuse, uncomplicated: Secondary | ICD-10-CM | POA: Insufficient documentation

## 2019-04-02 DIAGNOSIS — Y92838 Other recreation area as the place of occurrence of the external cause: Secondary | ICD-10-CM | POA: Diagnosis not present

## 2019-04-02 DIAGNOSIS — F17210 Nicotine dependence, cigarettes, uncomplicated: Secondary | ICD-10-CM | POA: Insufficient documentation

## 2019-04-02 DIAGNOSIS — S92344B Nondisplaced fracture of fourth metatarsal bone, right foot, initial encounter for open fracture: Secondary | ICD-10-CM | POA: Diagnosis not present

## 2019-04-02 DIAGNOSIS — Y998 Other external cause status: Secondary | ICD-10-CM | POA: Insufficient documentation

## 2019-04-02 DIAGNOSIS — S99921A Unspecified injury of right foot, initial encounter: Secondary | ICD-10-CM | POA: Diagnosis present

## 2019-04-02 LAB — POCT PREGNANCY, URINE: Preg Test, Ur: NEGATIVE

## 2019-04-02 MED ORDER — CEPHALEXIN 500 MG PO CAPS: 1000.0000 mg | ORAL_CAPSULE | Freq: Two times a day (BID) | ORAL | 0 refills | Status: DC

## 2019-04-02 MED ORDER — TRAMADOL HCL 50 MG PO TABS: 50.0000 mg | ORAL_TABLET | Freq: Four times a day (QID) | ORAL | 0 refills | Status: DC | PRN

## 2019-04-02 MED ORDER — SULFAMETHOXAZOLE-TRIMETHOPRIM 800-160 MG PO TABS: 1.0000 | ORAL_TABLET | Freq: Two times a day (BID) | ORAL | 0 refills | Status: DC
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
## 2021-03-12 ENCOUNTER — Encounter (HOSPITAL_COMMUNITY): Payer: Self-pay | Admitting: Emergency Medicine

## 2021-03-12 ENCOUNTER — Encounter (INDEPENDENT_AMBULATORY_CARE_PROVIDER_SITE_OTHER): Payer: Self-pay | Admitting: Ophthalmology

## 2021-03-12 ENCOUNTER — Ambulatory Visit (INDEPENDENT_AMBULATORY_CARE_PROVIDER_SITE_OTHER): Payer: Medicaid Other | Admitting: Ophthalmology

## 2021-03-12 ENCOUNTER — Other Ambulatory Visit: Payer: Self-pay

## 2021-03-12 ENCOUNTER — Emergency Department (HOSPITAL_COMMUNITY)
Admission: EM | Admit: 2021-03-12 | Discharge: 2021-03-12 | Disposition: A | Discharge status: Home / Self Care | Payer: Medicaid Other | Attending: Emergency Medicine | Admitting: Emergency Medicine

## 2021-03-12 DIAGNOSIS — F17210 Nicotine dependence, cigarettes, uncomplicated: Secondary | ICD-10-CM | POA: Diagnosis not present

## 2021-03-12 DIAGNOSIS — R519 Headache, unspecified: Secondary | ICD-10-CM | POA: Diagnosis not present

## 2021-03-12 DIAGNOSIS — H04121 Dry eye syndrome of right lacrimal gland: Secondary | ICD-10-CM

## 2021-03-12 DIAGNOSIS — H3581 Retinal edema: Secondary | ICD-10-CM | POA: Diagnosis not present

## 2021-03-12 DIAGNOSIS — H5711 Ocular pain, right eye: Secondary | ICD-10-CM | POA: Insufficient documentation

## 2021-03-12 MED ORDER — FLUORESCEIN SODIUM 1 MG OP STRP
1.0000 | ORAL_STRIP | Freq: Once | OPHTHALMIC | Status: AC
  Administered 2021-03-12: 1 via OPHTHALMIC
  Filled 2021-03-12: qty 1

## 2021-03-12 MED ORDER — ACETAZOLAMIDE 250 MG PO TABS: 500.0000 mg | ORAL_TABLET | Freq: Once | ORAL | Status: DC

## 2021-03-12 MED ORDER — DORZOLAMIDE HCL-TIMOLOL MAL 2-0.5 % OP SOLN
1.0000 [drp] | Freq: Two times a day (BID) | OPHTHALMIC | Status: DC
  Administered 2021-03-12: 1 [drp] via OPHTHALMIC
  Filled 2021-03-12: qty 10

## 2021-03-12 MED ORDER — TETRACAINE HCL 0.5 % OP SOLN
2.0000 [drp] | Freq: Once | OPHTHALMIC | Status: AC
  Administered 2021-03-12: 2 [drp] via OPHTHALMIC
  Filled 2021-03-12: qty 4

## 2021-03-12 MED ORDER — TIMOLOL MALEATE 0.5 % OP SOLN: 1.0000 [drp] | Freq: Once | OPHTHALMIC | Status: DC

## 2021-03-12 MED ORDER — APRACLONIDINE HCL 1 % OP SOLN
1.0000 [drp] | Freq: Once | OPHTHALMIC | Status: DC
  Filled 2021-03-12: qty 2.4

## 2021-03-12 NOTE — Progress Notes (Signed)
[facility] - Clinic Note  03/12/2021     CHIEF COMPLAINT Patient presents for Retina Evaluation   HISTORY OF PRESENT ILLNESS: Briana Anderson is a 30 y.o. female who presents to the clinic today for:   HPI     Retina Evaluation   In right eye.  Associated Symptoms Pain.  I, the attending physician,  performed the HPI with the patient and updated documentation appropriately.        Comments   Pt referred from ED for increased eye pain and pressure in OD. Pt states yesterday she was experiencing pain and pressure OD. She does state her vision is going in and out, black out then comes back if she tilts her head. 10/10 on pain scale for OD. Pt does not wear corrections. Sensitivity to light is reported. She does report some pain and vision going in and out for a few weeks, has gotten increasingly worse.       Last edited by Rennis Chris, MD on 03/12/2021  9:42 PM.    Pt has been having intermittent pain OD for several yrs, but pain always resolved on its own.  Symptoms this time began last night while pt was walking to the store, and all of a sudden her vision OD "went black" and OD began to hurt immensely.  Pt also became extremely photophobic.  Was seen in the ED today and given gtts for pressure OD.  Pt says eye pain is intense and constant.  Pt uses eye gtts occasionally; doesn't know exact type--doesn't seem to help.  Referring physician: [facility], [facility] [address] [place],  [place] [address]  HISTORICAL INFORMATION:   Selected notes from the MEDICAL RECORD NUMBER Referred by ED for eval of vision loss/pain/pressure/photophobia OD x 1 day   CURRENT MEDICATIONS: No current outpatient medications on file. (Ophthalmic Drugs)   No current facility-administered medications for this visit. (Ophthalmic Drugs)   Current Outpatient Medications (Other)  Medication Sig   cephALEXin (KEFLEX) 500 MG capsule Take 2 capsules (1,000 mg total) by mouth 2  (two) times daily.   cyclobenzaprine (FLEXERIL) 5 MG tablet Take 1-2 tablets 3 times daily as needed   ibuprofen (ADVIL,MOTRIN) 800 MG tablet Take 1 tablet (800 mg total) by mouth every 8 (eight) hours as needed.   sulfamethoxazole-trimethoprim (BACTRIM DS) 800-160 MG tablet Take 1 tablet by mouth 2 (two) times daily.   traMADol (ULTRAM) 50 MG tablet Take 1 tablet (50 mg total) by mouth every 6 (six) hours as needed.   No current facility-administered medications for this visit. (Other)      REVIEW OF SYSTEMS: ROS   Positive for: Eyes Last edited by Thompson Grayer, COT on 03/12/2021  3:35 PM.       ALLERGIES No Known Allergies  PAST MEDICAL HISTORY Past Medical History:  Diagnosis Date   Polysubstance abuse (HCC)    Unknown if patient has history of psychiatric disorder    Past Surgical History:  Procedure Laterality Date   ECTOPIC PREGNANCY SURGERY      FAMILY HISTORY History reviewed. No pertinent family history.  SOCIAL HISTORY Social History   Tobacco Use   Smoking status: Every Day    Packs/day: 0.50    Pack years: 0.00    Types: Cigarettes   Smokeless tobacco: Never  Substance Use Topics   Alcohol use: Yes   Drug use: Yes    Types: Marijuana  OPHTHALMIC EXAM:  Base Eye Exam     Visual Acuity (Snellen - Linear)       Right Left   Dist [place] 20/100 20/30 -2         Tonometry (Tonopen, 3:44 PM)       Right Left   Pressure 16 12         Pupils   Deferred, pt was unable to open both eyes to check pupils due to photophobia.         Visual Fields       Left Right    Full    Restrictions  Total superior temporal, inferior temporal deficiencies; Partial inner superior nasal, inferior nasal deficiencies         Extraocular Movement       Right Left    Full, Ortho Full, Ortho         Neuro/Psych     Oriented x3: Yes   Mood/Affect: Normal         Dilation     Both eyes: 1.0% Mydriacyl, 2.5% Phenylephrine @  3:45 PM           Slit Lamp and Fundus Exam     Slit Lamp Exam       Right Left   Lids/Lashes Normal Normal   Conjunctiva/Sclera Mild melanosis Mild melanosis   Cornea 3+ fine inferior PEE Clear   Anterior Chamber Deep and quiet; no cell/flare Deep and quiet   Iris Round and dilated Round and dilated   Lens Clear Clear   Vitreous Normal Normal         Fundus Exam       Right Left   Disc Pink, sharp Pink, sharp   C/D Ratio 0.2 0.3   Macula Flat, good foveal reflex, no heme or edema Flat, good foveal reflex, no heme or edema   Vessels Mild attenuation Mild attenuation   Periphery Attached Attached            IMAGING AND PROCEDURES  Imaging and Procedures for 03/12/2021  OCT, Retina - OU - Both Eyes       Right Eye Quality was borderline. Central Foveal Thickness: 252. Progression has no prior data. Findings include normal foveal contour, no IRF, no SRF.   Left Eye Quality was borderline. Central Foveal Thickness: 246. Progression has no prior data. Findings include normal foveal contour, no IRF, no SRF.   Notes *Images captured and stored on drive  Diagnosis / Impression:  NFP; no IRF/SRF OU  Clinical management:  See below  Abbreviations: NFP - Normal foveal profile. CME - cystoid macular edema. PED - pigment epithelial detachment. IRF - intraretinal fluid. SRF - subretinal fluid. EZ - ellipsoid zone. ERM - epiretinal membrane. ORA - outer retinal atrophy. ORT - outer retinal tubulation. SRHM - subretinal hyper-reflective material. IRHM - intraretinal hyper-reflective material              ASSESSMENT/PLAN:    ICD-10-CM   1. Dry eye syndrome, right  H04.121     2. Ocular pain, right eye  H57.11     3. Retinal edema  H35.81 OCT, Retina - OU - Both Eyes      1. Eye pain secondary to Dry Eye OD  - exam shows 3+ fine PEE inferior cornea  - eye pain symptoms relieved with topical proparacaine -- suggestive of ocular surface disease  -  discussed findings and pathophysiology of dry eyes          -  Use AT Q1hr while awake, tear gel/ung qhs prn          - Sample of Lotemax SM given - Use QID OD until sample bottle runs out          - F/u here prn  Ophthalmic Meds Ordered this visit:  No orders of the defined types were placed in this encounter.      Return if symptoms worsen or fail to improve.  There are no Patient Instructions on file for this visit.   Explained the diagnoses, plan, and follow up with the patient and they expressed understanding.  Patient expressed understanding of the importance of proper follow up care.   This document serves as a record of services personally performed by Karie Chimera, MD, PhD. It was created on their behalf by Cristopher Estimable, COT an ophthalmic technician. The creation of this record is the provider's dictation and/or activities during the visit.    Electronically signed by: Cristopher Estimable, COT 7.6.22 @ 9:47 PM   Karie Chimera, M.D., Ph.D. Diseases & Surgery of the Retina and Vitreous [facility] 7.6.22  I have reviewed the above documentation for accuracy and completeness, and I agree with the above. Karie Chimera, M.D., Ph.D. 03/12/21 9:47 PM   Abbreviations: M myopia (nearsighted); A astigmatism; H hyperopia (farsighted); P presbyopia; Mrx spectacle prescription;  CTL contact lenses; OD right eye; OS left eye; OU both eyes  XT exotropia; ET esotropia; PEK punctate epithelial keratitis; PEE punctate epithelial erosions; DES dry eye syndrome; MGD meibomian gland dysfunction; ATs artificial tears; PFAT's preservative free artificial tears; NSC nuclear sclerotic cataract; PSC posterior subcapsular cataract; ERM epi-retinal membrane; PVD posterior vitreous detachment; RD retinal detachment; DM diabetes mellitus; DR diabetic retinopathy; NPDR non-proliferative diabetic retinopathy; PDR proliferative diabetic retinopathy; CSME clinically significant macular  edema; DME diabetic macular edema; dbh dot blot hemorrhages; CWS cotton wool spot; POAG primary open angle glaucoma; C/D cup-to-disc ratio; HVF humphrey visual field; GVF goldmann visual field; OCT optical coherence tomography; IOP intraocular pressure; BRVO Branch retinal vein occlusion; CRVO central retinal vein occlusion; CRAO central retinal artery occlusion; BRAO branch retinal artery occlusion; RT retinal tear; SB scleral buckle; PPV pars plana vitrectomy; VH Vitreous hemorrhage; PRP panretinal laser photocoagulation; IVK intravitreal kenalog; VMT vitreomacular traction; MH Macular hole;  NVD neovascularization of the disc; NVE neovascularization elsewhere; AREDS age related eye disease study; ARMD age related macular degeneration; POAG primary open angle glaucoma; EBMD epithelial/anterior basement membrane dystrophy; ACIOL anterior chamber intraocular lens; IOL intraocular lens; PCIOL posterior chamber intraocular lens; Phaco/IOL phacoemulsification with intraocular lens placement; PRK photorefractive keratectomy; LASIK laser assisted in situ keratomileusis; HTN hypertension; DM diabetes mellitus; COPD chronic obstructive pulmonary disease

## 2021-03-12 NOTE — ED Triage Notes (Signed)
Patient reports increasing pain with redness /blurred vision at right eye onset this week , denies injury , patient added large mole at scalp.

## 2021-03-12 NOTE — Discharge Instructions (Addendum)
You were seen in the emergency department today with eye pain.  I am sending you home with eyedrops and have discussed your case with the ophthalmologist listed.  Please go directly to the eye doctor's office.  The address is listed on the form. It is important that you follow with the eye doctor today.

## 2021-03-12 NOTE — ED Provider Notes (Signed)
Emergency Department Provider Note   I have reviewed the triage vital signs and the nursing notes.   HISTORY  Chief Complaint Eye Pain    HPI Briana Anderson is a 30 y.o. female presents to the ED with right eye pain and tearing with vision change. Notes some HA. Has had intermittent symptoms for the last several weeks but worsening suddenly. No fever. No drainage. No injury. No contacts. No radiation of symptoms or modifying factors.    Past Medical History:  Diagnosis Date   Polysubstance abuse (HCC)    Unknown if patient has history of psychiatric disorder     There are no problems to display for this patient.   Past Surgical History:  Procedure Laterality Date   ECTOPIC PREGNANCY SURGERY      Allergies Patient has no known allergies.  No family history on file.  Social History Social History   Tobacco Use   Smoking status: Every Day    Packs/day: 0.50    Pack years: 0.00    Types: Cigarettes   Smokeless tobacco: Never  Substance Use Topics   Alcohol use: Yes   Drug use: Yes    Types: Marijuana    Review of Systems  Constitutional: No fever/chills Eyes: Decreased vision in the right eye with watering.  ENT: No sore throat.  Cardiovascular: Denies chest pain. Respiratory: Denies shortness of breath. Gastrointestinal: No abdominal pain.  No nausea, no vomiting.  No diarrhea.  No constipation. Genitourinary: Negative for dysuria. Musculoskeletal: Negative for back pain. Skin: Negative for rash. Neurological: Negative for headaches, focal weakness or numbness.  10-point ROS otherwise negative.  ____________________________________________   PHYSICAL EXAM:  VITAL SIGNS: ED Triage Vitals  Enc Vitals Group     BP 03/12/21 0618 (!) 151/106     Pulse Rate 03/12/21 0618 86     Resp 03/12/21 0618 16     Temp 03/12/21 0618 98.9 F (37.2 C)     Temp Source 03/12/21 0618 Oral     SpO2 03/12/21 0618 99 %    Constitutional: Alert and oriented.  Well appearing and in no acute distress. Eyes: Conjunctivae are normal. Photophobia in the right eye. IOP 25 R and 12 on the left. Watering to the right eye.  Head: Atraumatic. Nose: No congestion/rhinnorhea. Mouth/Throat: Mucous membranes are moist.   Neck: No stridor.  Cardiovascular: Normal rate, regular rhythm. Good peripheral circulation. Grossly normal heart sounds.   Respiratory: Normal respiratory effort.  No retractions. Lungs CTAB. Musculoskeletal: No lower extremity tenderness nor edema. No gross deformities of extremities. Neurologic:  Normal speech and language. No gross focal neurologic deficits are appreciated.  Skin:  Skin is warm, dry and intact. No rash noted.   ____________________________________________   PROCEDURES  Procedure(s) performed:   Procedures  None ____________________________________________   INITIAL IMPRESSION / ASSESSMENT AND PLAN / ED COURSE  Pertinent labs & imaging results that were available during my care of the patient were reviewed by me and considered in my medical decision making (see chart for details).   IOP increased on the right. Discussed with ophthalmology on call. Drops given and patient to go to the office at time of discharge for evaluation. Patient in agreement with plan.    ____________________________________________  FINAL CLINICAL IMPRESSION(S) / ED DIAGNOSES  Final diagnoses:  Acute right eye pain     MEDICATIONS GIVEN DURING THIS VISIT:  Medications  tetracaine (PONTOCAINE) 0.5 % ophthalmic solution 2 drop (2 drops Right Eye Given 03/12/21 1233)  fluorescein ophthalmic strip 1 strip (1 strip Right Eye Given 03/12/21 1233)      Note:  This document was prepared using Dragon voice recognition software and may include unintentional dictation errors.  Alona Bene, MD, [facility]    Misha Vanoverbeke, Arlyss Repress, MD 03/16/21 [phone]

## 2021-03-12 NOTE — ED Notes (Signed)
Patient argumentative/verbally abusive with staff at triage during encounter.

## 2021-03-12 NOTE — ED Notes (Signed)
Pt states she wants an eye, states she knows what is best for her. MD at bedside. MD advised pt to go straight to eye dr and they are waiting. Pt refuse d/c vital signs . Verbalized understanding of d/c instructions and follow up care.

## 2022-07-17 ENCOUNTER — Emergency Department (HOSPITAL_COMMUNITY)
Admission: EM | Admit: 2022-07-17 | Discharge: 2022-07-18 | Disposition: A | Discharge status: Home / Self Care | Payer: Medicaid Other | Attending: Emergency Medicine | Admitting: Emergency Medicine

## 2022-07-17 ENCOUNTER — Other Ambulatory Visit: Payer: Self-pay

## 2022-07-17 ENCOUNTER — Encounter (HOSPITAL_COMMUNITY): Payer: Self-pay | Admitting: Emergency Medicine

## 2022-07-17 DIAGNOSIS — F10129 Alcohol abuse with intoxication, unspecified: Secondary | ICD-10-CM | POA: Diagnosis present

## 2022-07-17 DIAGNOSIS — Y908 Blood alcohol level of 240 mg/100 ml or more: Secondary | ICD-10-CM | POA: Diagnosis not present

## 2022-07-17 DIAGNOSIS — F10920 Alcohol use, unspecified with intoxication, uncomplicated: Secondary | ICD-10-CM

## 2022-07-17 DIAGNOSIS — F17210 Nicotine dependence, cigarettes, uncomplicated: Secondary | ICD-10-CM | POA: Diagnosis not present

## 2022-07-17 NOTE — ED Provider Triage Note (Signed)
Emergency Medicine Provider Triage Evaluation Note  Briana Anderson , a 31 y.o. female  was evaluated in triage.  Pt complains of possible drug overdose.  Patient reports that today she began drinking around 3.  The patient states that she had beer, also drank some liquor that she bought from someone in her neighborhood.  Patient denies any drug use.  The patient reportedly went home, was found to be unresponsive by her godfather.  The patient was given intranasal Narcan with EMS which the patient responded well to.  The patient is denying any drug use at this time.  Patient denies any medical complaints, to states that her mouth is dry.  Patient denies any chest pain, shortness of breath, nausea or vomiting.  Patient denies any drug use.  Review of Systems  Positive:  Negative:   Physical Exam  BP (!) 165/111 (BP Location: Right Arm)   Pulse 94   Temp 98 F (36.7 C) (Oral)   Resp 16   Ht 5\' 1"  (1.549 m)   Wt 68 kg   SpO2 100%   BMI 28.33 kg/m  Gen:   Awake, no distress   Resp:  Normal effort  MSK:   Moves extremities without difficulty  Other:    Medical Decision Making  Medically screening exam initiated at 10:55 PM.  Appropriate orders placed.  Briana Anderson was informed that the remainder of the evaluation will be completed by another provider, this initial triage assessment does not replace that evaluation, and the importance of remaining in the ED until their evaluation is complete.     Marcene Duos, PA-C 07/17/22 2256

## 2022-07-17 NOTE — ED Triage Notes (Signed)
Pt BIB EMS with c/o etoh and possible drug overdose, pt was given 2mg  Narcan IN. Pt had snoring respirations and pinpoint pupils with RR of 14. Pt states that she drank alcohol from someone in the complex, smoked a couple of cigarettes and some marijuana. Pt ambulatory to triage.

## 2022-07-18 LAB — URINALYSIS, ROUTINE W REFLEX MICROSCOPIC
Bilirubin Urine: NEGATIVE
Glucose, UA: NEGATIVE mg/dL
Hgb urine dipstick: NEGATIVE
Ketones, ur: NEGATIVE mg/dL
Nitrite: NEGATIVE
Protein, ur: 30 mg/dL — AB
Specific Gravity, Urine: 1.011 (ref 1.005–1.030)
pH: 5 (ref 5.0–8.0)

## 2022-07-18 LAB — CBC
HCT: 41.8 % (ref 36.0–46.0)
Hemoglobin: 13.9 g/dL (ref 12.0–15.0)
MCH: 30.9 pg (ref 26.0–34.0)
MCHC: 33.3 g/dL (ref 30.0–36.0)
MCV: 92.9 fL (ref 80.0–100.0)
Platelets: 298 10*3/uL (ref 150–400)
RBC: 4.5 MIL/uL (ref 3.87–5.11)
RDW: 13.4 % (ref 11.5–15.5)
WBC: 9.9 10*3/uL (ref 4.0–10.5)
nRBC: 0 % (ref 0.0–0.2)

## 2022-07-18 LAB — PREGNANCY, URINE: Preg Test, Ur: NEGATIVE

## 2022-07-18 LAB — RAPID URINE DRUG SCREEN, HOSP PERFORMED
Amphetamines: NOT DETECTED
Barbiturates: NOT DETECTED
Benzodiazepines: NOT DETECTED
Cocaine: NOT DETECTED
Opiates: NOT DETECTED
Tetrahydrocannabinol: POSITIVE — AB

## 2022-07-18 LAB — BASIC METABOLIC PANEL
Anion gap: 9 (ref 5–15)
BUN: 11 mg/dL (ref 6–20)
CO2: 24 mmol/L (ref 22–32)
Calcium: 9.6 mg/dL (ref 8.9–10.3)
Chloride: 109 mmol/L (ref 98–111)
Creatinine, Ser: 0.64 mg/dL (ref 0.44–1.00)
GFR, Estimated: >60 mL/min (ref >60)
Glucose, Bld: 88 mg/dL (ref 70–99)
Potassium: 3.8 mmol/L (ref 3.5–5.1)
Sodium: 142 mmol/L (ref 135–145)

## 2022-07-18 LAB — ETHANOL: Alcohol, Ethyl (B): 277 mg/dL — ABNORMAL HIGH (ref <10)

## 2022-07-18 NOTE — ED Notes (Signed)
Pt phone is charging at the nurses station.

## 2022-07-18 NOTE — Discharge Instructions (Signed)
Substance Abuse Treatment Programs ° °Intensive Outpatient Programs °[facility]     °[address]      °[place], [place]                   °[phone]      ° °[facility] °[address] °[place], [place] °[phone] ° °[facility]     °(Inpatient and outpatient)     °[address]           °[phone]   ° °[facility] °[phone] (Suboxone and Methadone) ° °[address]      °[place], [place] [address]      °[phone]      ° °[address] °[place], [place] °[phone] ° °[facility] (Outpatient/Inpatient, Chemical)    °(insurance only) [phone]      °       °[facility] (Groups & Residential) °[place], [place] °[phone] ° °   °[facility]     °[address]     °[place], [place]      °[phone]      ° °[facility] (for caregivers and family) °[address] °[place], [place] °336-299-4655 ° ° ° ° ° °Residential Treatment Programs °[facility]      °[address], [place], [place] [address]  °[phone]      ° °[facility] °[address] , [place] [address] °[phone] ° °[facility]        °[phone]      ° °[facility] °[phone] ° °[facility]             °[address]                                         °[place], [place]                                                °[phone] or [phone]                              ° °[facility] [place] °[address] °[place], [address] °[phone] ° °[facility]    °[address]      °[place], [place]     °[phone]      ° °[facility] °[address] °, [place] °[phone] ° °[facility]   °[address]     °[place], [place] [address]     °[phone]      °Admissions: 8am-3pm M-F ° °[facility] °[address] °[place],  [place] °336-227-7417 ° °[facility]: Residential Program (90 Days)   °[place], [place]      °336-725-8389 or [phone]    ° °[facility] °[place], [place] °(Walk in Hours over the weekend or by referral) ° °[facility] °[address], [place],  [address] °([phone] ° °Crisis Mobile: [facility]:  1-877-626-1772 (for crisis response 24 hours a day) °[facility] Hotline:      1-800-256-2452 °Outpatient Psychiatry and Counseling ° °[facility]: Mobile Crisis   Management 24 hours:  1-877-626-1772 ° °[facility] sliding scale fee and walk in schedule: M-F 8am-12pm/1pm-3pm °[address]  °[place], [place] [address] °[phone] ° °[facility] °[address] °[place], [place] [address] °[phone] ° °[facility] (Former[facility] known [facility])- new patient walk-in appointments available Monday - Friday 8am -3pm.          °[address] °[place], [place] [address] °336-676-6840 or crisis line- 336-676-6905 ° °[facility]/ Intensive Outpatient Therapy Program °[address] °[place], [place] [address] °336-832-9804 ° °[facility]                  °Crisis Services      °[phone]      °[address]     °[place], [place] [address]                ° °[facility]   °[facility] °[phone] °[address] °[place], [place] [address] ° ° °[facility]          °[address]  °[place], [place] [address]       °[phone] ° °[facility] °[address] °[place], [place] [address] °336-292-1510 ° °[facility]    °[address]    °[place], [place] [address]     °[phone]      ° °Parish McKinney, MD     °[address]     °[place] [address]     °[phone]     °  °[facility] °[address] °[place] [address] ° °[facility]     °[address]     °[place]      °336-542-2076      ° °[facility] °Shamsher Ahluwalia, MD °[address] °[place], [place] [address] °[phone] ° °[facility]     °[address]     °[place], [place] [address]     °336-274-1237      ° °[facility] °[address] °[place], [place] [address] °336-854-4450 °Resources for Temporary Residential Assistance/Crisis Centers ° °DAY CENTERS °[facility] °M-F 8am-3pm   °[address] [place], [place] [address]   336-332-0824 °Services include: laundry, barbering, support groups, case management, phone  & computer access, showers, AA/NA mtgs, mental health/substance abuse nurse, job skills class, disability information, VA assistance, spiritual classes, etc.  ° °HOMELESS SHELTERS ° °[facility]     °[facility]   °[address], [place]     °[phone]       °       °[facility] (women and children)       °[address] °[place], [place] [address] °336-275-0820 °[email or web address] for application and process °Application Required ° °[facility]   °[address]    °[place] [address]     °[phone]       °             °[facility] °[address] °,  [address] °336.273.5572 °336-235-0363(schedule application appt.) °Application Required ° °[facility] (women only)    °[address]     °[place],  [address]     °336-884-1039      °  Intake starts 6pm daily °Need valid ID, SSC, & Police report °[facility] [place] °[address] °[place], [place] °336-881-5420 °Application Required ° °[facility] (men only)     °[address]      °[place], [place]     °[phone]      ° °[facility] °(Pregnant women only) °[address] °[place], [place] °336-275-0206 ° °[facility]      °[address]      °[place], [place] [address]     °[phone]      °       °[facility] [place] [facility] °[address] °[place], [place] °336-723-1848 °90 day commitment/SA/Application process ° °[facility](men only)     °[address]     °[place], [place]     °[phone]       °Check-in at 7pm     °       °[facility] °[address] °[place], [place] [address] °336-248-6684 °Men/Women/Women and Children must be there by 7 pm ° °[facility] °[place],  °336-722-8721                ° °

## 2022-07-18 NOTE — ED Provider Notes (Signed)
[facility]-EMERGENCY DEPT Provider Note   CSN: [id number] Arrival date & time: 07/17/22  2233     History  Chief Complaint  Patient presents with   Alcohol Intoxication    Briana Anderson is a 31 y.o. female.  The history is provided by the patient.   Patient presented via EMS for possible overdose.  She reported drinking alcohol starting around 3 PM.  She was later found unresponsive by family.  Intranasal Narcan was given and patient responded.  She adamantly denies any opiate use.  Patient admits to smoking cigarettes and marijuana    Home Medications Prior to Admission medications   Not on File      Allergies    Patient has no known allergies.    Review of Systems   Review of Systems  Physical Exam Updated Vital Signs BP 108/75   Pulse 88   Temp 98 F (36.7 C) (Oral)   Resp 12   Ht 1.549 m (5\' 1" )   Wt 68 kg   SpO2 99%   BMI 28.33 kg/m  Physical Exam CONSTITUTIONAL: Disheveled, lying on her right side sleeping appears intoxicated HEAD: Normocephalic/atraumatic ENMT: Mucous membranes moist NECK: supple no meningeal signs LUNGS: no apparent distress ABDOMEN: soft NEURO: Pt is sleeping but is arousable, moves all extremities x4 EXTREMITIES: No obvious deformities  ED Results / Procedures / Treatments   Labs (all labs ordered are listed, but only abnormal results are displayed) Labs Reviewed  URINALYSIS, ROUTINE W REFLEX MICROSCOPIC - Abnormal; Notable for the following components:      Result Value   APPearance HAZY (*)    Protein, ur 30 (*)    Leukocytes,Ua MODERATE (*)    Bacteria, UA RARE (*)    All other components within normal limits  RAPID URINE DRUG SCREEN, HOSP PERFORMED - Abnormal; Notable for the following components:   Tetrahydrocannabinol POSITIVE (*)    All other components within normal limits  ETHANOL - Abnormal; Notable for the following components:   Alcohol, Ethyl (B) 277 (*)    All other components  within normal limits  CBC  BASIC METABOLIC PANEL  PREGNANCY, URINE    EKG EKG Interpretation  Date/Time:  Friday July 17 2022 23:18:40 EST Ventricular Rate:  98 PR Interval:  152 QRS Duration: 86 QT Interval:  361 QTC Calculation: 461 R Axis:   63 Text Interpretation: Sinus rhythm Baseline wander in lead(s) V1 Confirmed by [id number] (Zadie Rhine) on 07/18/2022 1:25:35 AM  Radiology No results found.  Procedures Procedures    Medications Ordered in ED Medications - No data to display  ED Course/ Medical Decision Making/ A&P Clinical Course as of 07/18/22 0533  Sat Jul 18, 2022  0151 Alcohol, Ethyl (B)(!): 277 Alcohol intoxication [DW]  0151 Patient resting comfortably when I evaluate her.  She is in no acute distress.  She is protecting her airway.  She is still intoxicated but is improving.  She is attempting to find a ride home [DW]  0532 Patient awake and alert in no acute distress.  She is attempting to call a ride.  She is safe for discharge.    [DW]    Clinical Course User Index [DW] Jul 20, 2022, MD                           Medical Decision Making Amount and/or Complexity of Data Reviewed Labs: ordered. Decision-making details documented in ED Course.  Final Clinical Impression(s) / ED Diagnoses Final diagnoses:  Alcoholic intoxication without complication [facility])    Rx / DC Orders ED Discharge Orders     None         Zadie Rhine, MD 07/18/22 [phone]

## 2022-07-18 NOTE — ED Notes (Signed)
Pt given [place] sandwich, applesauce, and water per request.

## 2022-12-26 ENCOUNTER — Emergency Department (HOSPITAL_COMMUNITY)
Admission: EM | Admit: 2022-12-26 | Discharge: 2022-12-27 | Disposition: A | Discharge status: Home / Self Care | Payer: Medicaid Other | Attending: Emergency Medicine | Admitting: Emergency Medicine

## 2022-12-26 DIAGNOSIS — R030 Elevated blood-pressure reading, without diagnosis of hypertension: Secondary | ICD-10-CM | POA: Diagnosis not present

## 2022-12-26 DIAGNOSIS — S0501XA Injury of conjunctiva and corneal abrasion without foreign body, right eye, initial encounter: Secondary | ICD-10-CM | POA: Diagnosis not present

## 2022-12-26 DIAGNOSIS — X58XXXA Exposure to other specified factors, initial encounter: Secondary | ICD-10-CM | POA: Diagnosis not present

## 2022-12-26 DIAGNOSIS — H5711 Ocular pain, right eye: Secondary | ICD-10-CM | POA: Diagnosis present

## 2022-12-27 ENCOUNTER — Encounter (HOSPITAL_COMMUNITY): Payer: Self-pay

## 2022-12-27 ENCOUNTER — Other Ambulatory Visit: Payer: Self-pay

## 2022-12-27 LAB — CBC WITH DIFFERENTIAL/PLATELET
Abs Immature Granulocytes: 0.02 10*3/uL (ref 0.00–0.07)
Basophils Absolute: 0.1 10*3/uL (ref 0.0–0.1)
Basophils Relative: 1 %
Eosinophils Absolute: 0.4 10*3/uL (ref 0.0–0.5)
Eosinophils Relative: 5 %
HCT: 38.6 % (ref 36.0–46.0)
Hemoglobin: 12.9 g/dL (ref 12.0–15.0)
Immature Granulocytes: 0 %
Lymphocytes Relative: 37 %
Lymphs Abs: 2.9 10*3/uL (ref 0.7–4.0)
MCH: 31.1 pg (ref 26.0–34.0)
MCHC: 33.4 g/dL (ref 30.0–36.0)
MCV: 93 fL (ref 80.0–100.0)
Monocytes Absolute: 0.5 10*3/uL (ref 0.1–1.0)
Monocytes Relative: 7 %
Neutro Abs: 3.9 10*3/uL (ref 1.7–7.7)
Neutrophils Relative %: 50 %
Platelets: 254 10*3/uL (ref 150–400)
RBC: 4.15 MIL/uL (ref 3.87–5.11)
RDW: 13.3 % (ref 11.5–15.5)
WBC: 7.8 10*3/uL (ref 4.0–10.5)
nRBC: 0 % (ref 0.0–0.2)

## 2022-12-27 LAB — BASIC METABOLIC PANEL
Anion gap: 7 (ref 5–15)
BUN: 7 mg/dL (ref 6–20)
CO2: 20 mmol/L — ABNORMAL LOW (ref 22–32)
Calcium: 8.8 mg/dL — ABNORMAL LOW (ref 8.9–10.3)
Chloride: 107 mmol/L (ref 98–111)
Creatinine, Ser: 0.66 mg/dL (ref 0.44–1.00)
GFR, Estimated: >60 mL/min (ref >60)
Glucose, Bld: 103 mg/dL — ABNORMAL HIGH (ref 70–99)
Potassium: 3.5 mmol/L (ref 3.5–5.1)
Sodium: 134 mmol/L — ABNORMAL LOW (ref 135–145)

## 2022-12-27 MED ORDER — HYDRALAZINE HCL 10 MG PO TABS
10.0000 mg | ORAL_TABLET | Freq: Once | ORAL | Status: AC
  Administered 2022-12-27: 10 mg via ORAL
  Filled 2022-12-27: qty 1

## 2022-12-27 MED ORDER — AMLODIPINE BESYLATE 5 MG PO TABS: 5.0000 mg | ORAL_TABLET | Freq: Every day | ORAL | 0 refills | Status: AC

## 2022-12-27 MED ORDER — TETRACAINE HCL 0.5 % OP SOLN
2.0000 [drp] | Freq: Once | OPHTHALMIC | Status: AC
  Administered 2022-12-27: 2 [drp] via OPHTHALMIC
  Filled 2022-12-27: qty 4

## 2022-12-27 MED ORDER — FLUORESCEIN SODIUM 1 MG OP STRP: 1.0000 | ORAL_STRIP | Freq: Once | OPHTHALMIC | Status: DC

## 2022-12-27 MED ORDER — MOXIFLOXACIN HCL 0.5 % OP SOLN: 1.0000 [drp] | OPHTHALMIC | 0 refills | Status: AC

## 2022-12-27 MED ORDER — TETRACAINE HCL 0.5 % OP SOLN: 2.0000 [drp] | Freq: Once | OPHTHALMIC | Status: DC

## 2022-12-27 MED ORDER — FLUORESCEIN SODIUM 1 MG OP STRP
ORAL_STRIP | OPHTHALMIC | Status: AC
  Filled 2022-12-27: qty 1

## 2022-12-27 MED ORDER — FLUORESCEIN SODIUM 1 MG OP STRP
1.0000 | ORAL_STRIP | Freq: Once | OPHTHALMIC | Status: AC
  Administered 2022-12-27: 1 via OPHTHALMIC
  Filled 2022-12-27: qty 1
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
## 2022-12-27 NOTE — Discharge Instructions (Addendum)
Use the eyedrops as prescribed You will use 2 drops into your right eye every 2 hours for 2 days then you will use 2 drops every 6 hours for 5 days   Follow-up with the ophthalmologist on Monday call first thing in the morning and tell them that you were seen in the emergency room
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
